# Patient Record
Sex: Male | Born: 1970 | Race: White | Hispanic: No | Marital: Married | Smoking: Never smoker
Health system: Southern US, Community
[De-identification: ages and names within clinical notes are randomized; demographics above are authoritative.]

## PROBLEM LIST (undated history)

## (undated) HISTORY — PX: HERNIA REPAIR: SHX51

---

## 1999-09-13 ENCOUNTER — Ambulatory Visit (HOSPITAL_COMMUNITY): Admission: RE | Admit: 1999-09-13 | Discharge: 1999-09-13 | Payer: Self-pay | Admitting: General Surgery

## 1999-09-13 ENCOUNTER — Encounter (INDEPENDENT_AMBULATORY_CARE_PROVIDER_SITE_OTHER): Payer: Self-pay | Admitting: Specialist

## 1999-12-13 ENCOUNTER — Emergency Department (HOSPITAL_COMMUNITY): Admission: EM | Admit: 1999-12-13 | Discharge: 1999-12-13 | Payer: Self-pay | Admitting: *Deleted

## 2004-11-25 ENCOUNTER — Ambulatory Visit (HOSPITAL_COMMUNITY): Admission: RE | Admit: 2004-11-25 | Discharge: 2004-11-25 | Payer: Self-pay | Admitting: General Surgery

## 2006-12-15 ENCOUNTER — Encounter (INDEPENDENT_AMBULATORY_CARE_PROVIDER_SITE_OTHER): Payer: Self-pay | Admitting: Internal Medicine

## 2006-12-15 ENCOUNTER — Ambulatory Visit: Payer: Self-pay

## 2009-05-24 ENCOUNTER — Encounter (INDEPENDENT_AMBULATORY_CARE_PROVIDER_SITE_OTHER): Payer: Self-pay | Admitting: Orthopedic Surgery

## 2009-05-24 ENCOUNTER — Ambulatory Visit: Payer: Self-pay | Admitting: Surgery

## 2009-05-24 ENCOUNTER — Ambulatory Visit: Admission: RE | Admit: 2009-05-24 | Discharge: 2009-05-24 | Payer: Self-pay | Admitting: Orthopedic Surgery

## 2009-09-04 ENCOUNTER — Emergency Department (HOSPITAL_COMMUNITY): Admission: EM | Admit: 2009-09-04 | Discharge: 2009-09-04 | Payer: Self-pay | Admitting: Emergency Medicine

## 2010-02-10 ENCOUNTER — Encounter: Payer: Self-pay | Admitting: Surgery

## 2010-04-05 LAB — DIFFERENTIAL
Basophils Absolute: 0 10*3/uL (ref 0.0–0.1)
Lymphocytes Relative: 22 % (ref 12–46)
Monocytes Absolute: 0.5 10*3/uL (ref 0.1–1.0)
Neutro Abs: 5.5 10*3/uL (ref 1.7–7.7)

## 2010-04-05 LAB — LIPASE, BLOOD: Lipase: 31 U/L (ref 11–59)

## 2010-04-05 LAB — URINALYSIS, ROUTINE W REFLEX MICROSCOPIC
Glucose, UA: NEGATIVE mg/dL
Ketones, ur: NEGATIVE mg/dL
Nitrite: NEGATIVE
Specific Gravity, Urine: 1.015 (ref 1.005–1.030)
Urobilinogen, UA: 1 mg/dL (ref 0.0–1.0)

## 2010-04-05 LAB — COMPREHENSIVE METABOLIC PANEL
Albumin: 4.2 g/dL (ref 3.5–5.2)
Alkaline Phosphatase: 54 U/L (ref 39–117)
BUN: 14 mg/dL (ref 6–23)
CO2: 28 mEq/L (ref 19–32)
Calcium: 9.3 mg/dL (ref 8.4–10.5)
Chloride: 106 mEq/L (ref 96–112)
GFR calc Af Amer: >60 mL/min (ref >60)
GFR calc non Af Amer: >60 mL/min (ref >60)
Glucose, Bld: 110 mg/dL — ABNORMAL HIGH (ref 70–99)
Potassium: 3.8 mEq/L (ref 3.5–5.1)
Total Bilirubin: 0.6 mg/dL (ref 0.3–1.2)

## 2010-04-05 LAB — CBC
MCV: 87.8 fL (ref 78.0–100.0)
Platelets: 203 10*3/uL (ref 150–400)
WBC: 7.9 10*3/uL (ref 4.0–10.5)

## 2010-06-07 NOTE — Op Note (Signed)
[name], [name]             ACCOUNT NO.:  [id number]   MEDICAL RECORD NO.:  [id number]          PATIENT TYPE:  AMB   LOCATION:  DAY                          FACILITY:  [facility]   PHYSICIAN:  Angelia Mould. Derrell Lolling, M.D.DATE OF BIRTH:  24-Apr-1970   DATE OF PROCEDURE:  11/25/2004  DATE OF DISCHARGE:                                 OPERATIVE REPORT   PREOPERATIVE DIAGNOSIS:  Recurrent ventral hernia.   POSTOPERATIVE DIAGNOSIS:  Recurrent ventral hernia.   OPERATION PERFORMED:  Laparoscopic repair of ventral hernia with 14 x 18 cm  piece of Proceed mesh.   SURGEON:  Angelia Mould. Derrell Lolling, M.D.   OPERATIVE INDICATIONS:  This is a 40 year old white man who had umbilical  hernia repair in 1999. For several months, he has noticed a bulge above and  to the right of the umbilicus. On exam, he has a well-healed transverse scar  below the umbilicus and he has a hernia that projects above and to the right  of the umbilicus, but this is mostly reducible. He is operated upon  electively.   OPERATIVE TECHNIQUE:  Following the induction of general endotracheal  anesthesia, the patient was identified. Intravenous antibiotics were given.  A Foley catheter was inserted. The abdomen was prepped and draped in a  sterile fashion. 0.5% Marcaine with epinephrine was used as a local  infiltration anesthetic. An 11 mm OptiView port was inserted in the left  upper quadrant of the abdomen without any difficulty. Pneumoperitoneum was  created. The video camera was inserted. We looked around and saw no evidence  of any injury. We placed two 5 mm trocars on the left side of the abdomen in  the left lower quadrant. We found that he had some omental adhesions in the  hernia and with counter traction and sharp scissor cautery dissection, we  were able to take all these down. The area of dissection was inspected and  there was no bleeding. We mapped out the hernia defect using a spinal needle  and then measured  outside the perimeter of the hernia defect for 3-4 cm in  all directions. We used a piece of mesh that was 18 x 14 cm. We brought a 15  x 20 cm piece of Proceed mesh to the operative field. We cut it down to 18 x  14 cm. We marked a template on the abdominal wall and on the mesh. We placed  six equidistant sutures of #0 Novofil in the mesh for suture fixation. We  then rolled the mesh up and inserted it in the abdominal cavity, opened it  up and positioned it appropriately according to the template. All the six  sutures were brought through the abdominal wall outside of the perimeter of  the marked template. After all of these were placed, the sutures were tied  down, providing good suture fixation. The mesh looked like it was fairly  well spread out. We used a 5 mm protacker to tack the edges of the mesh to  the abdominal wall. We were careful to use counter traction to seat the  tacks well into the abdominal  wall. We were very careful to make the tacks  no more than 1 cm apart. We then made an inner circle of tacks as well. This  provided very good security. We had a little bit of bleeding on the left  lower side of the mesh, but we did not lose more than 5 mL there and we  observed if for 5-10 minutes and there was no active bleeding. We examined  the small bowel and colon, everything looked fine, there is no sign of any  injury. The trocars were removed under direct vision and there is no  bleeding from trocar sites. Pneumoperitoneum was released. Pneumoperitoneum  was released.  The skin incision were closed with metal staples and covered with sterile  bandages. The patient tolerated the procedure well and was taken to the  recovery room in stable condition. Estimated blood loss was about 10 mL.  Complications none. Sponge, needle and instrument counts were correct.      Angelia Mould. Derrell Lolling, M.D.  Electronically Signed     HMI/MEDQ  D:  11/25/2004  T:  11/25/2004  Job:  [id number]

## 2010-06-07 NOTE — Op Note (Signed)
[facility]  Patient:    [name], [name]                      MRN: [id number] Proc. Date: 09/13/99 Adm. Date:  [id number] Disc. Date: [id number] Attending:  Brandy Hale CC:         Fortunato Curling, M.D.   Operative Report  PREOPERATIVE DIAGNOSIS:  Incarcerated and possibly strangulated umbilical hernia.  POSTOPERATIVE DIAGNOSIS:  Strangulated umbilical hernia.  OPERATION PERFORMED:  Repair of strangulated umbilical hernia, and debridement of infarcted omentum.  SURGEON:  Angelia Mould. Derrell Lolling, M.D.  ANESTHESIA:  General endotracheal.  OPERATIVE INDICATIONS:  This is a 40 year old white man who has had a bulge in his umbilicus for four to five years.  This has not bothered him very much. For the past 48 hours, he has had progressive pain and swelling and redness around his umbilicus to where it is now quite uncomfortable.  He denies nausea or vomiting, or changes in his bowel habits.  He denies fever or drainage.  On examination, he has a soft, nondistended abdomen, but around the umbilicus, there is a 2.5 cm area of swelling that is tender, erythematous, indurated and nonreducible.  It is felt that he has an incarcerated and possibly strangulated umbilical hernia, and is brought to the operating room urgently for repair.  DESCRIPTION OF PROCEDURE:  Following the induction of general endotracheal anesthesia, the patients abdomen was prepped and draped in a sterile fashion. A curved transverse incision was made at the lower rim of the umbilicus. Dissection was carried down through the subcutaneous tissue to the anterior abdominal wall fascia.  A 2.5 cm firm hernia sac was dissected away from the umbilicus.  The fascia was incised transversely on the right and the left to free up the hernia sac.  The hernia sac was opened, but it contained some infarcted omentum which was debrided and removed.  The hernia sac was quite thickened.  We  spent some time examining the hernia sac and found that there was no evidence of no GI tract injury or incarceration.  The hernia sac was then debrided and sent for histologic examination.  Hemostasis was good and achieved with electrocautery.  The wound was irrigated copiously with several hundred cubic centimeters of saline.  We undermined the subcutaneous tissues circumferentially to facilitate closure of the fascia.  Once we were satisfied that the wound was clean and hemostatic, we closed the fascia transversely with five or six interrupted sutures of #1 Novofil.  This provided a very secure closure of the fascia.  The wound was further irrigated.  The umbilicus was tacked back down to the anterior abdominal wall fascia with 3-0 Vicryl suture.  The subcutaneous tissue was closed with 3-0 Vicryl sutures.  The skin was closed with a running subcuticular suture of 4-0 Vicryl and Steri-Strips.  Clean bandages were placed.  The patient was taken to the recovery room in stable condition.  Estimated blood loss was about 25 cc.  Complications none. Sponge, needle, and instrument counts were correct. DD:  09/13/99 TD:  09/15/99 Job: [id number]

## 2011-07-02 IMAGING — CR DG ABDOMEN 2V
3 series · 3 of 3 positions shown · non-contrast
Comparison: None.

CLINICAL DATA: Abdominal pain

ABDOMEN - 2 VIEW

[view not recorded (1 of 3)]
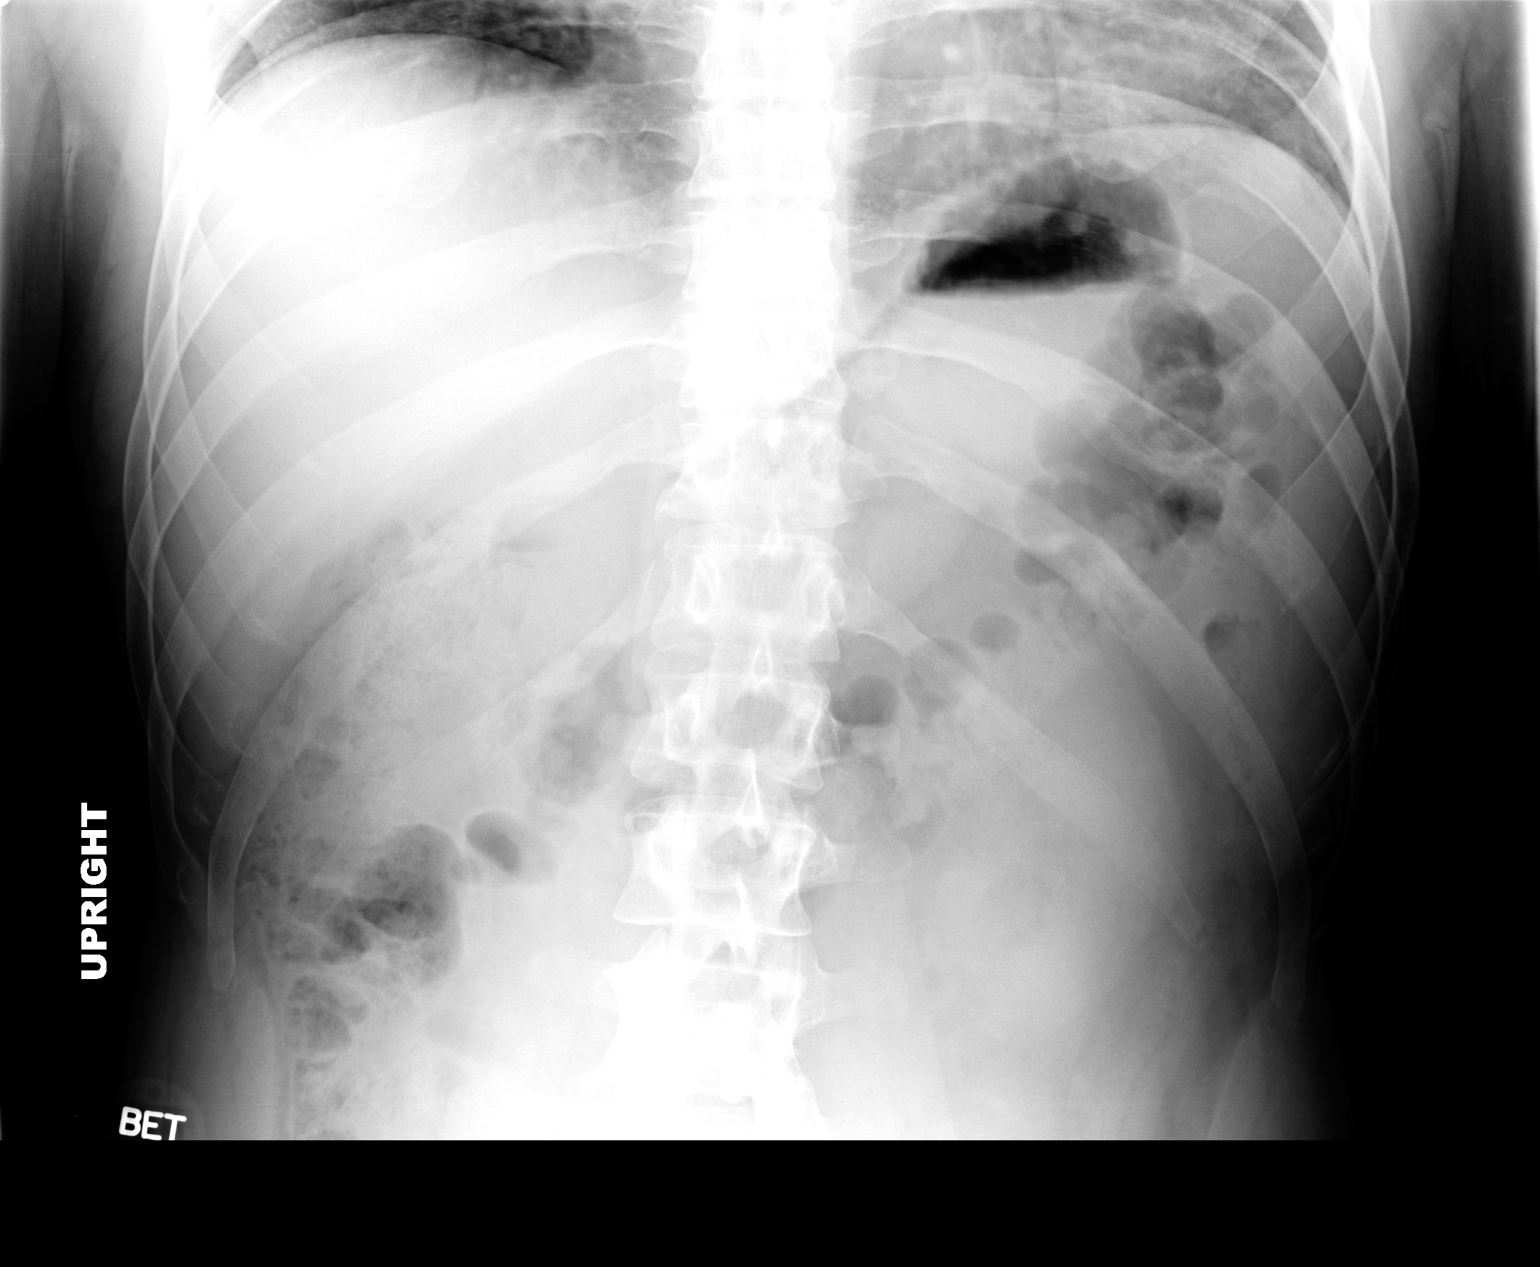

[view not recorded (2 of 3)]
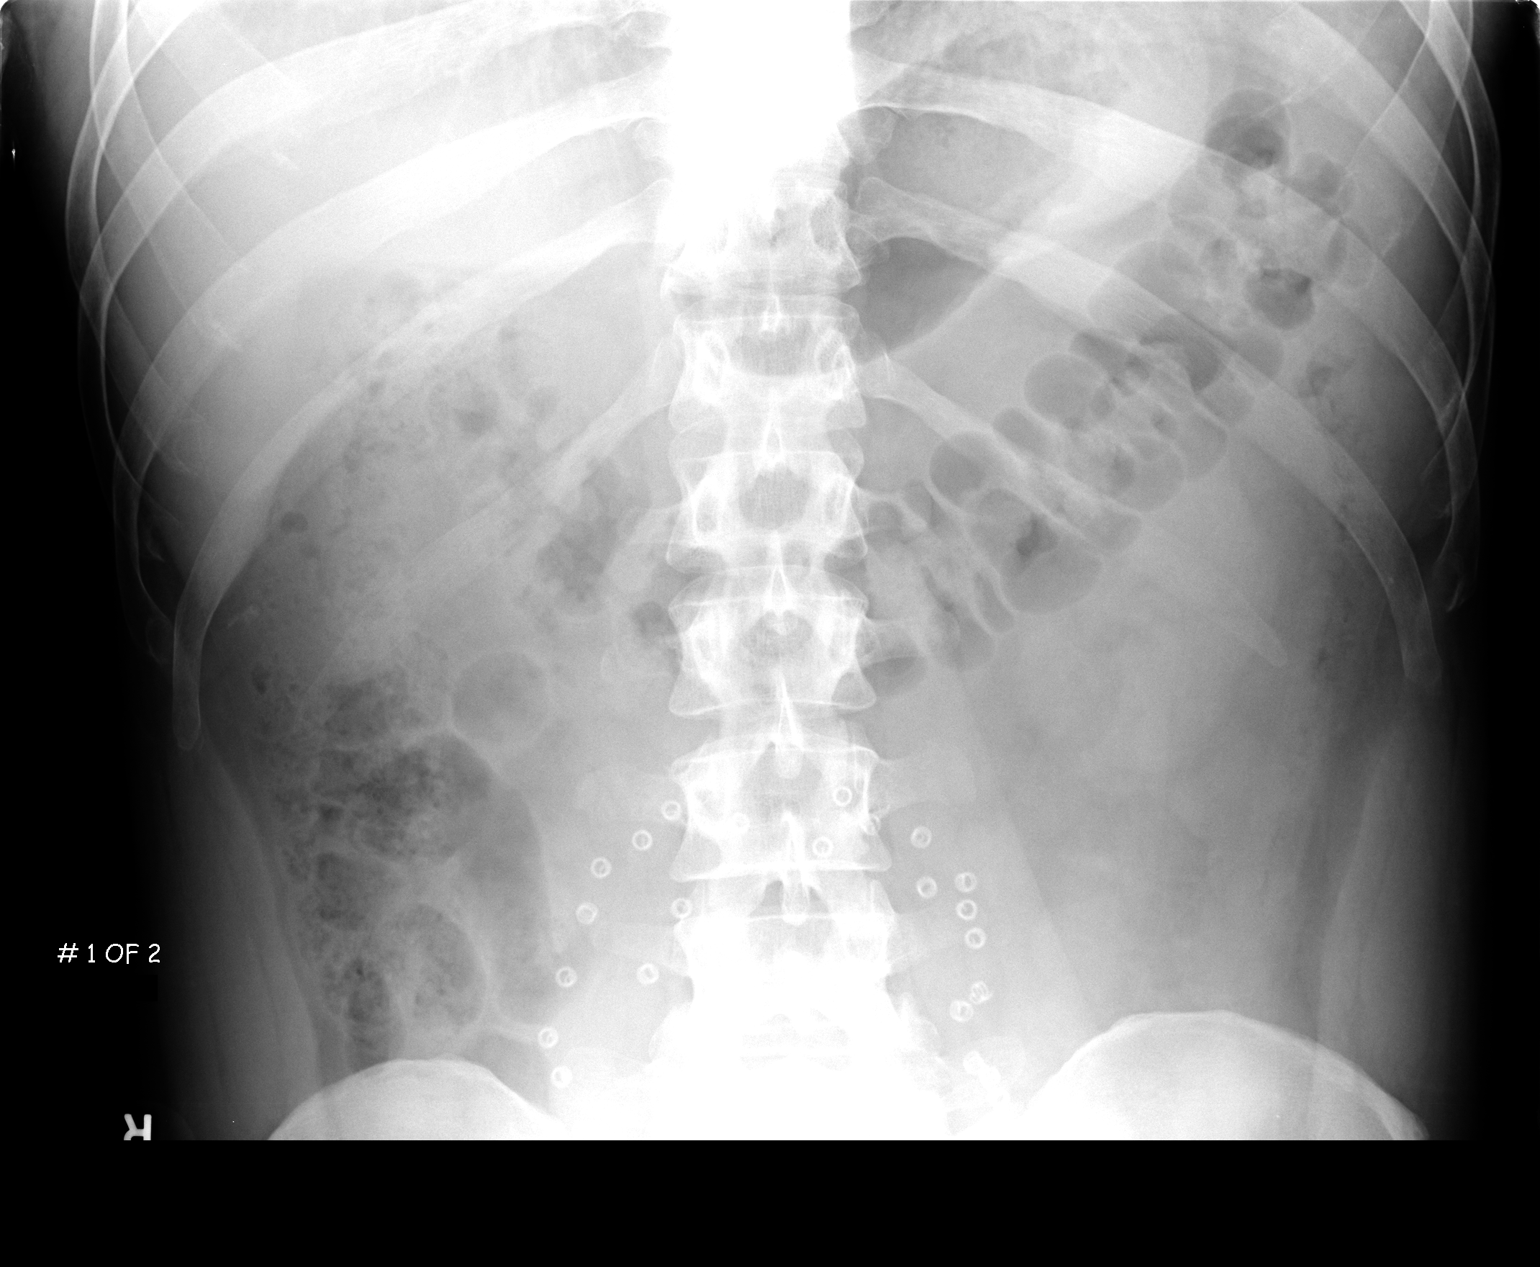

[view not recorded (3 of 3)]
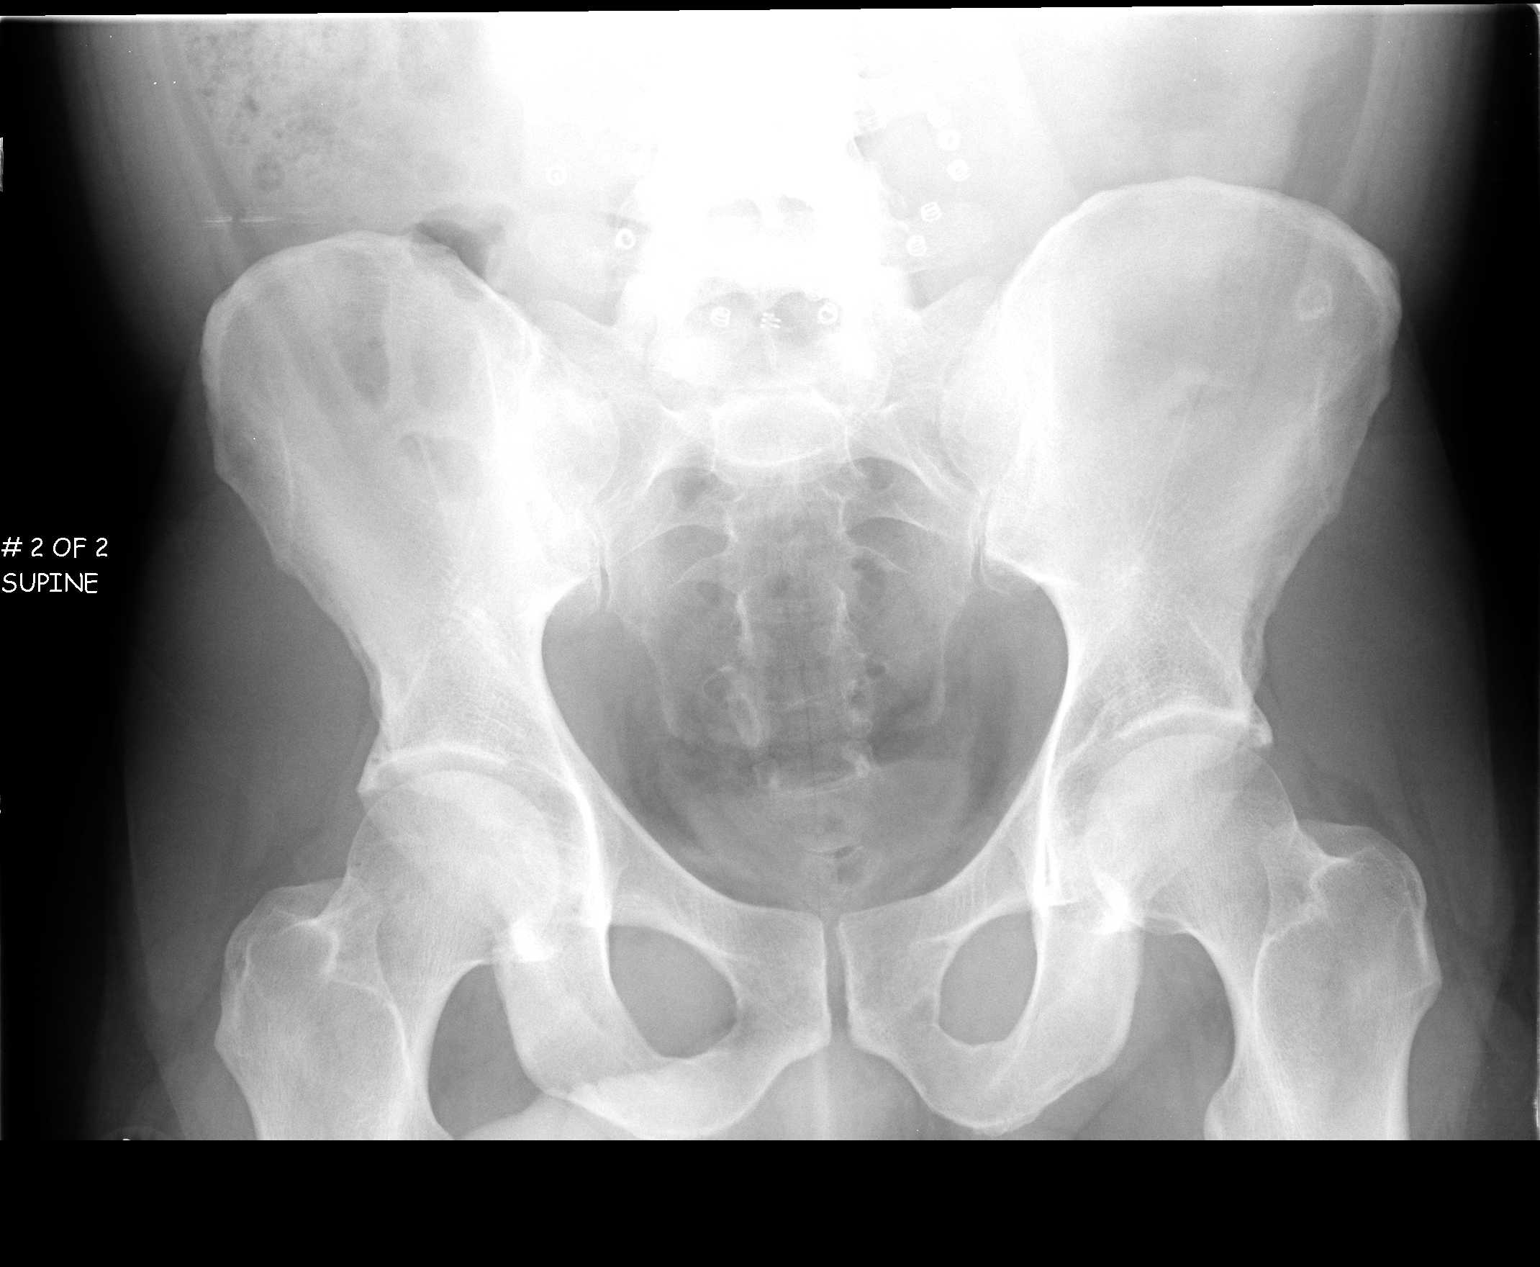

[3 of 3 positions shown; findings below may reference images not displayed]

FINDINGS: There is a general paucity of small bowel gas in the
abdomen.  Gas and stool seen throughout the colon.  The stomach is
not distended.  No intra-abdominal free air seen.  Postsurgical
changes status post hernia repair seen with a mesh in place in the
central lower portion of the abdomen.  The lung bases are clear.
The bones are unremarkable.
IMPRESSION: Nonobstructive bowel gas pattern.

## 2017-01-08 ENCOUNTER — Emergency Department (HOSPITAL_COMMUNITY)
Admission: EM | Admit: 2017-01-08 | Discharge: 2017-01-08 | Disposition: A | Discharge status: Home / Self Care | Payer: 59 | Attending: Emergency Medicine | Admitting: Emergency Medicine

## 2017-01-08 ENCOUNTER — Encounter (HOSPITAL_COMMUNITY): Payer: Self-pay | Admitting: Emergency Medicine

## 2017-01-08 ENCOUNTER — Other Ambulatory Visit: Payer: Self-pay

## 2017-01-08 DIAGNOSIS — H6592 Unspecified nonsuppurative otitis media, left ear: Secondary | ICD-10-CM

## 2017-01-08 DIAGNOSIS — R42 Dizziness and giddiness: Secondary | ICD-10-CM | POA: Insufficient documentation

## 2017-01-08 DIAGNOSIS — R030 Elevated blood-pressure reading, without diagnosis of hypertension: Secondary | ICD-10-CM | POA: Diagnosis not present

## 2017-01-08 DIAGNOSIS — H65199 Other acute nonsuppurative otitis media, unspecified ear: Secondary | ICD-10-CM | POA: Diagnosis not present

## 2017-01-08 LAB — BASIC METABOLIC PANEL
ANION GAP: 9 (ref 5–15)
BUN: 17 mg/dL (ref 6–20)
CALCIUM: 9 mg/dL (ref 8.9–10.3)
CHLORIDE: 100 mmol/L — AB (ref 101–111)
CO2: 25 mmol/L (ref 22–32)
Creatinine, Ser: 1.09 mg/dL (ref 0.61–1.24)
GFR calc non Af Amer: >60 mL/min (ref >60)
GLUCOSE: 105 mg/dL — AB (ref 65–99)
Potassium: 3.8 mmol/L (ref 3.5–5.1)
Sodium: 134 mmol/L — ABNORMAL LOW (ref 135–145)

## 2017-01-08 LAB — CBC WITH DIFFERENTIAL/PLATELET
BASOS ABS: 0 10*3/uL (ref 0.0–0.1)
BASOS PCT: 0 %
Eosinophils Absolute: 0.1 10*3/uL (ref 0.0–0.7)
Eosinophils Relative: 1 %
HEMATOCRIT: 41.4 % (ref 39.0–52.0)
HEMOGLOBIN: 13.9 g/dL (ref 13.0–17.0)
LYMPHS PCT: 22 %
Lymphs Abs: 1.7 10*3/uL (ref 0.7–4.0)
MCH: 30.1 pg (ref 26.0–34.0)
MCHC: 33.6 g/dL (ref 30.0–36.0)
MCV: 89.6 fL (ref 78.0–100.0)
MONO ABS: 0.5 10*3/uL (ref 0.1–1.0)
MONOS PCT: 6 %
NEUTROS ABS: 5.4 10*3/uL (ref 1.7–7.7)
NEUTROS PCT: 71 %
Platelets: 197 10*3/uL (ref 150–400)
RBC: 4.62 MIL/uL (ref 4.22–5.81)
RDW: 12.7 % (ref 11.5–15.5)
WBC: 7.6 10*3/uL (ref 4.0–10.5)

## 2017-01-08 NOTE — ED Triage Notes (Signed)
Pt states he woke up today to go to bathroom and he started feeling dizzy, tingling on his hand and he check his BP and was 168/110 at home, pt doen't have a hx of BP him self but has a family hx of high BP.

## 2017-01-08 NOTE — ED Provider Notes (Signed)
[name]   CSN: [id number] Arrival date & time: 01/08/17  0411     History   Chief Complaint Chief Complaint  Patient presents with  . Hypertension    HPI [name] is a 46 y.o. male who presents the emergency department with complaint of hypertension and lightheadedness.  Patient states that he has been recently sick and had a shot of Decadron 3 days ago.  Since that time he has felt extremely jittery, anxious, wound up and had difficulty sleeping.  He states that in the mornings he wakes up with a sensation of itching in both of his hands that resolves very quickly.  This morning patient states that he got up he is bathroom around 3 AM and he noticed that sensation in both hands.  When he went to use the restroom he turned quickly and noticed that he felt slightly off balance.  He took his blood pressure with his wife's home cough and noted that his systolic pressure was 170.  This made him extremely anxious and he came to the emergency department seeking evaluation.  He denies headaches, changes in vision, unilateral weakness difficulty with speech or swallowing.  The patient does note a sensation of fullness in the ears but denies any tenderness or pain.  He is currently on Augmentin.  He denies fever or chills.  He denies any chest pain, shortness of breath.  His symptoms have resolved.  HPI  History reviewed. No pertinent past medical history.  There are no active problems to display for this patient.   Past Surgical History:  Procedure Laterality Date  . HERNIA REPAIR         Home Medications    Prior to Admission medications   Not on File    Family History History reviewed. No pertinent family history.  Social History Social History   Tobacco Use  . Smoking status: Never Smoker  . Smokeless tobacco: Never Used  Substance Use Topics  . Alcohol use: No    Frequency: Never  . Drug use: No      Allergies   Patient has no known allergies.   Review of Systems Review of Systems Ten systems reviewed and are negative for acute change, except as noted in the HPI.    Physical Exam Updated Vital Signs BP (!) 153/99   Pulse 74   Resp 18   Ht 5\' 11"  (1.803 m)   Wt 97.5 kg (215 lb)   SpO2 96%   BMI 29.99 kg/m   Physical Exam  Constitutional: He is oriented to person, place, and time. He appears well-developed and well-nourished. No distress.  HENT:  Head: Normocephalic and atraumatic.  Right Ear: Hearing and tympanic membrane normal.  Left Ear: Hearing normal. A middle ear effusion is present.  Eyes: Conjunctivae and EOM are normal. Pupils are equal, round, and reactive to light. No scleral icterus.  Neck: Normal range of motion. Neck supple.  Cardiovascular: Normal rate, regular rhythm and normal heart sounds.  Pulmonary/Chest: Effort normal and breath sounds normal. No respiratory distress.  Abdominal: Soft. There is no tenderness.  Musculoskeletal: He exhibits no edema.  Neurological: He is alert and oriented to person, place, and time.  Speech is clear and goal oriented, follows commands Major Cranial nerves without deficit, no facial droop Normal strength in upper and lower extremities bilaterally including dorsiflexion and plantar flexion, strong and equal grip strength Sensation normal to light and sharp touch Moves extremities without  ataxia, coordination intact Normal finger to nose and rapid alternating movements Neg romberg, no pronator drift Normal gait Normal heel-shin and balance   Skin: Skin is warm and dry. He is not diaphoretic.  Psychiatric: His behavior is normal.  Nursing note and vitals reviewed.    ED Treatments / Results  Labs (all labs ordered are listed, but only abnormal results are displayed) Labs Reviewed  BASIC METABOLIC PANEL - Abnormal; Notable for the following components:      Result Value   Sodium 134 (*)    Chloride 100  (*)    Glucose, Bld 105 (*)    All other components within normal limits  CBC WITH DIFFERENTIAL/PLATELET    EKG  EKG Interpretation None       Radiology No results found.  Procedures Procedures (including critical care time)  Medications Ordered in ED Medications - No data to display   Initial Impression / Assessment and Plan / ED Course  I have reviewed the triage vital signs and the nursing notes.  Pertinent labs & imaging results that were available during my care of the patient were reviewed by me and considered in my medical decision making (see chart for details).    Patient with C/o htn and dizziness. I feel his sxs are related to his decadron, a stressful weak and his middle ear effusion. He has a normal neurologic examination.  I have discussed the case with Dr. Blinda LeatherwoodPollina who agrees that these sxs are benign. Advise the patient to have pressures rechecked when decadron is totally out of his system. Discussed return precautions.  Final Clinical Impressions(s) / ED Diagnoses   Final diagnoses:  Elevated blood pressure reading  Left otitis media with effusion    ED Discharge Orders    None       Arthor CaptainHarris, Rahmon Heigl, PA-C 01/08/17 0736    Gilda CreasePollina, Christopher J, MD 01/09/17 [phone]

## 2017-01-08 NOTE — Discharge Instructions (Signed)
Get help right away if: You develop a severe headache or confusion. You have unusual weakness or numbness. You feel faint. You have severe pain in your chest or abdomen. You vomit repeatedly. You have trouble breathing.    Contact a health care provider if: Your symptoms do not go away after treatment. Your symptoms come back after treatment. You are unable to pop your ears. You have: A fever. Pain in your ear. Pain in your head or neck. Fluid draining from your ear. Your hearing suddenly changes. You become very dizzy. You lose your balance.

## 2023-07-20 ENCOUNTER — Ambulatory Visit
Admission: EM | Admit: 2023-07-20 | Discharge: 2023-07-20 | Disposition: A | Discharge status: Home / Self Care | Attending: Family Medicine | Admitting: Family Medicine

## 2023-07-20 DIAGNOSIS — R030 Elevated blood-pressure reading, without diagnosis of hypertension: Secondary | ICD-10-CM | POA: Diagnosis not present

## 2023-07-20 DIAGNOSIS — Z91030 Bee allergy status: Secondary | ICD-10-CM

## 2023-07-20 DIAGNOSIS — L249 Irritant contact dermatitis, unspecified cause: Secondary | ICD-10-CM | POA: Diagnosis not present

## 2023-07-20 MED ORDER — HYDROXYZINE HCL 25 MG PO TABS: 12.5000 mg | ORAL_TABLET | Freq: Three times a day (TID) | ORAL | 0 refills | Status: AC | PRN

## 2023-07-20 NOTE — ED Provider Notes (Signed)
 [facility]  Note:  This document was prepared using Conservation officer, historic buildings and may include unintentional dictation errors.  MRN: [id number] DOB: Jan 13, 1971  Subjective:   [name] is a 53 y.o. male presenting for 1 day history of persistent left elbow swelling, itching and redness.  Symptoms started from suffering a sting from a yellowjacket.  No facial swelling, oral swelling, chest pain, shortness of breath, nausea, vomiting, abdominal pain, confusion, weakness, numbness or tingling, hematuria, hives.  Has previously reacted this way to a yellowjacket sting.  No history of anaphylaxis.  Would like to avoid steroids in any way.  Has previously had elevated blood pressure readings but no diagnosis of hypertension.  He last had an annual physical about 2 or 3 months ago and reports that all of his vital signs were normal as well as his testing.  No alcohol use.  No drug use.  No current facility-administered medications for this encounter. No current outpatient medications on file.   Allergies  Allergen Reactions   Prednisone     Elevated BP    History reviewed. No pertinent past medical history.   Past Surgical History:  Procedure Laterality Date   HERNIA REPAIR      No family history on file.  Social History   Tobacco Use   Smoking status: Never   Smokeless tobacco: Never  Vaping Use   Vaping status: Never Used  Substance Use Topics   Alcohol use: No   Drug use: No    ROS   Objective:   Vitals: BP (!) 167/110 (BP Location: Right Arm)   Pulse (!) 114   Temp 98.1 F (36.7 C) (Oral)   Resp 20   SpO2 97%   BP Readings from Last 3 Encounters:  07/20/23 (!) 167/110  01/08/17 (!) 153/99    Physical Exam Constitutional:      General: He is not in acute distress.    Appearance: Normal appearance. He is well-developed. He is not ill-appearing, toxic-appearing or diaphoretic.  HENT:     Head: Normocephalic and  atraumatic.     Right Ear: External ear normal.     Left Ear: External ear normal.     Nose: Nose normal.     Mouth/Throat:     Mouth: Mucous membranes are moist.   Eyes:     General: No scleral icterus.       Right eye: No discharge.        Left eye: No discharge.     Extraocular Movements: Extraocular movements intact.   Neck:     Vascular: No carotid bruit.   Cardiovascular:     Rate and Rhythm: Normal rate and regular rhythm.     Heart sounds: Normal heart sounds. No murmur heard.    No friction rub. No gallop.  Pulmonary:     Effort: Pulmonary effort is normal. No respiratory distress.     Breath sounds: Normal breath sounds. No stridor. No wheezing, rhonchi or rales.   Skin:    Findings: Rash (1+ swelling about the left forearm with associated redness, slight warmth but no tenderness) present.   Neurological:     Mental Status: He is alert and oriented to person, place, and time.   Psychiatric:        Mood and Affect: Mood normal.        Behavior: Behavior normal.        Thought Content: Thought content normal.     Assessment and  Plan :   PDMP not reviewed this encounter.  1. Irritant dermatitis   2. Yellow jacket sting allergy   3. Elevated blood pressure reading without diagnosis of hypertension    Patient is not a good candidate for steroids.  Emphasized the need to monitor his blood pressure.  He will continue to check for this at home, would like to defer starting treatment for this.  For now, recommend hydroxyzine.  Offered topical steroid but he refused.  No signs of anaphylaxis and therefore will defer ER visit.  Counseled patient on potential for adverse effects with medications prescribed/recommended today, ER and return-to-clinic precautions discussed, patient verbalized understanding.    Christopher Savannah, [place] 07/20/23 8581

## 2023-07-20 NOTE — ED Triage Notes (Signed)
 Pt reports yellow sting to LUE yesterday-c/o red, swelling to elbow area-taking zyrtec-NAD-steady gait
